# Patient Record
Sex: Male | Born: 1975
Health system: Southern US, Community
[De-identification: ages and names within clinical notes are randomized; demographics above are authoritative.]

## PROBLEM LIST (undated history)

## (undated) DIAGNOSIS — L919 Hypertrophic disorder of the skin, unspecified: Secondary | ICD-10-CM

## (undated) DIAGNOSIS — L909 Atrophic disorder of skin, unspecified: Secondary | ICD-10-CM

## (undated) DIAGNOSIS — G4730 Sleep apnea, unspecified: Secondary | ICD-10-CM

## (undated) DIAGNOSIS — M1990 Unspecified osteoarthritis, unspecified site: Secondary | ICD-10-CM

## (undated) DIAGNOSIS — I10 Essential (primary) hypertension: Secondary | ICD-10-CM

## (undated) DIAGNOSIS — F429 Obsessive-compulsive disorder, unspecified: Secondary | ICD-10-CM

## (undated) DIAGNOSIS — J309 Allergic rhinitis, unspecified: Secondary | ICD-10-CM

## (undated) HISTORY — DX: Essential (primary) hypertension: I10

## (undated) HISTORY — DX: Unspecified osteoarthritis, unspecified site: M19.90

## (undated) HISTORY — DX: Hypertrophic disorder of the skin, unspecified: L91.9

## (undated) HISTORY — DX: Allergic rhinitis, unspecified: J30.9

## (undated) HISTORY — DX: Obsessive-compulsive disorder, unspecified: F42.9

## (undated) HISTORY — DX: Sleep apnea, unspecified: G47.30

## (undated) HISTORY — DX: Atrophic disorder of skin, unspecified: L90.9

---

## 2000-06-20 HISTORY — PX: RHINOPLASTY: SUR1284

## 2000-06-20 HISTORY — PX: TONSILLECTOMY AND ADENOIDECTOMY: SUR1326

## 2003-06-21 DIAGNOSIS — F429 Obsessive-compulsive disorder, unspecified: Secondary | ICD-10-CM | POA: Insufficient documentation

## 2009-06-16 DIAGNOSIS — L909 Atrophic disorder of skin, unspecified: Secondary | ICD-10-CM | POA: Insufficient documentation

## 2010-06-28 ENCOUNTER — Ambulatory Visit: Payer: Self-pay

## 2010-10-31 ENCOUNTER — Emergency Department: Payer: Self-pay | Admitting: Emergency Medicine

## 2011-06-29 DIAGNOSIS — M1711 Unilateral primary osteoarthritis, right knee: Secondary | ICD-10-CM | POA: Insufficient documentation

## 2013-06-06 ENCOUNTER — Ambulatory Visit: Payer: Self-pay

## 2013-06-16 ENCOUNTER — Ambulatory Visit: Payer: Self-pay | Admitting: Family Medicine

## 2014-05-20 LAB — TSH: TSH: 1.35 u[IU]/mL (ref 0.41–5.90)

## 2014-05-20 LAB — BASIC METABOLIC PANEL
BUN: 45 mg/dL — AB (ref 4–21)
Creatinine: 1.1 mg/dL (ref 0.6–1.3)
GLUCOSE: 95 mg/dL
Potassium: 4.7 mmol/L (ref 3.4–5.3)
SODIUM: 139 mmol/L (ref 137–147)

## 2014-05-20 LAB — LIPID PANEL
Cholesterol: 169 mg/dL (ref 0–200)
HDL: 51 mg/dL (ref 35–70)
LDL Cholesterol: 92 mg/dL
Triglycerides: 129 mg/dL (ref 40–160)

## 2014-11-10 ENCOUNTER — Encounter: Payer: Self-pay | Admitting: *Deleted

## 2014-11-10 DIAGNOSIS — I10 Essential (primary) hypertension: Secondary | ICD-10-CM | POA: Insufficient documentation

## 2014-11-10 DIAGNOSIS — R0683 Snoring: Secondary | ICD-10-CM | POA: Insufficient documentation

## 2014-11-10 DIAGNOSIS — M67439 Ganglion, unspecified wrist: Secondary | ICD-10-CM | POA: Insufficient documentation

## 2014-11-10 DIAGNOSIS — R400 Somnolence: Secondary | ICD-10-CM | POA: Insufficient documentation

## 2014-11-10 DIAGNOSIS — M2241 Chondromalacia patellae, right knee: Secondary | ICD-10-CM | POA: Insufficient documentation

## 2014-11-10 DIAGNOSIS — J309 Allergic rhinitis, unspecified: Secondary | ICD-10-CM | POA: Insufficient documentation

## 2014-11-10 DIAGNOSIS — G4733 Obstructive sleep apnea (adult) (pediatric): Secondary | ICD-10-CM | POA: Insufficient documentation

## 2014-12-04 ENCOUNTER — Ambulatory Visit: Payer: Self-pay | Admitting: Family Medicine

## 2014-12-05 ENCOUNTER — Ambulatory Visit (INDEPENDENT_AMBULATORY_CARE_PROVIDER_SITE_OTHER): Payer: 59 | Admitting: Family Medicine

## 2014-12-05 ENCOUNTER — Encounter: Payer: Self-pay | Admitting: Family Medicine

## 2014-12-05 VITALS — BP 140/94 | HR 88 | Temp 97.6°F | Resp 16 | Ht 67.0 in | Wt 301.0 lb

## 2014-12-05 DIAGNOSIS — L409 Psoriasis, unspecified: Secondary | ICD-10-CM

## 2014-12-05 DIAGNOSIS — F429 Obsessive-compulsive disorder, unspecified: Secondary | ICD-10-CM

## 2014-12-05 DIAGNOSIS — F42 Obsessive-compulsive disorder: Secondary | ICD-10-CM | POA: Diagnosis not present

## 2014-12-05 DIAGNOSIS — M7712 Lateral epicondylitis, left elbow: Secondary | ICD-10-CM | POA: Insufficient documentation

## 2014-12-05 DIAGNOSIS — IMO0001 Reserved for inherently not codable concepts without codable children: Secondary | ICD-10-CM

## 2014-12-05 DIAGNOSIS — R030 Elevated blood-pressure reading, without diagnosis of hypertension: Secondary | ICD-10-CM | POA: Diagnosis not present

## 2014-12-05 NOTE — Progress Notes (Signed)
Patient: Joshua Montes Male    DOB: [birth year]   39 y.o.   MRN: [id number] Visit Date: 12/05/2014  Today's Provider: Lelon Huh, MD   Chief Complaint  Patient presents with  . Follow-up    5 month  . Hypertension   Subjective:    Hypertension This is a recurrent problem. The current episode started more than 1 year ago. The problem is unchanged. The problem is uncontrolled. Pertinent negatives include no anxiety, blurred vision, chest pain, headaches, malaise/fatigue, neck pain, orthopnea, palpitations, peripheral edema or shortness of breath. There are no associated agents to hypertension. Past treatments include nothing.     Hypertension, follow-up:  BP Readings from Last 3 Encounters:  12/05/14 140/94  05/20/14 150/106    He was last seen for hypertension 6 months ago.  BP at that visit was 150/106. Management changes since that visit include Advised lifestyle changes. If lifestyle changes do not improve BP will start medication. He reports poor compliance with treatment. He is not having side effects. none  He is not exercising. He is not adherent to low salt diet.   Outside blood pressures are n/a. He is experiencing none.  Patient denies none.   Cardiovascular risk factors include hypertension, male gender and obesity (BMI >= 30 kg/m2).  Use of agents associated with hypertension: none.    Wt Readings from Last 3 Encounters:  12/05/14 301 lb (136.533 kg)  05/20/14 298 lb (135.172 kg)   Weight trend: stable Current diet: in general, an "unhealthy" diet  ------------------------------------------------------------------------  Left elbow has been painful the last few months. No specific injuries, but works a lot on computer which seems to aggravate it. He had steroid shot in his other elbow a few years ago and he didn't think it helped that much.   Follow up on sleep apnea 05/20/2014, no changes were made    Previous Medications   DICLOFENAC SODIUM (VOLTAREN)  1 % GEL    Place 1 application onto the skin every 6 (six) hours as needed.   FLUTICASONE (FLONASE) 50 MCG/ACT NASAL SPRAY    Place 1-2 sprays into the nose daily.   SERTRALINE (ZOLOFT) 100 MG TABLET    Take 1 tablet by mouth daily.    Review of Systems  Constitutional: Negative for malaise/fatigue.  Eyes: Negative for blurred vision.  Respiratory: Negative for shortness of breath.   Cardiovascular: Negative for chest pain, palpitations, orthopnea and leg swelling.  Musculoskeletal: Negative for neck pain.  Neurological: Negative for headaches.    History  Substance Use Topics  . Smoking status: Former Smoker -- 0.50 packs/day for 10 years    Types: Cigarettes    Quit date: 06/21/2007  . Smokeless tobacco: Not on file  . Alcohol Use: 0.0 oz/week    0 Standard drinks or equivalent per week   Family History  Problem Relation Age of Onset  . Cancer Other     lung cancer   . Family History  Problem Relation Age of Onset  . Cancer      lung cancer  . Heart disease Neg Hx   . Stroke Neg Hx      Objective:   BP 140/94 mmHg  Pulse 88  Temp(Src) 97.6 F (36.4 C) (Oral)  Resp 16  Ht 5\' 7"  (1.702 m)  Wt 301 lb (136.533 kg)  BMI 47.13 kg/m2  SpO2 95%  Physical Exam  General Appearance:    Alert, cooperative, no distress, morbidly obese  Eyes:    PERRL,  conjunctiva/corneas clear, EOM's intact       Lungs:     Clear to auscultation bilaterally, respirations unlabored  Heart:    Regular rate and rhythm  Neurologic:   Awake, alert, oriented x 3. No apparent focal neurological           defect.   MS:  Mildly tender left lateral epicondyle. FROM. No swelling or erythema.         Assessment & Plan:     1. Elevated blood pressure He is going to work on reducing calorie intake and reducing sodium in diet. Continue to check every 6 months.   2. Obsessive compulsive disorder He states sertraline is working well and wishes to continue current dose unchanged.   3. Lateral  epicondylitis of left elbow Limit OTC NSAIDs to 1-2 pills a day. Recommend frequent icing. Suggested some ergonomic changes to his workstation. Consider OT or orthopedic referral. He is not interested in steroid injection.    Follow up: Return in about 6 months (around 06/06/2015).

## 2014-12-27 ENCOUNTER — Other Ambulatory Visit: Payer: Self-pay | Admitting: Family Medicine

## 2015-06-05 ENCOUNTER — Ambulatory Visit: Payer: 59 | Admitting: Family Medicine

## 2015-09-01 ENCOUNTER — Other Ambulatory Visit: Payer: Self-pay | Admitting: *Deleted

## 2015-09-01 MED ORDER — SERTRALINE HCL 100 MG PO TABS: 100.0000 mg | ORAL_TABLET | Freq: Every day | ORAL | Status: DC

## 2015-09-01 NOTE — Telephone Encounter (Signed)
Requesting 90 day supply.

## 2016-06-12 ENCOUNTER — Other Ambulatory Visit: Payer: Self-pay | Admitting: Family Medicine

## 2017-09-10 ENCOUNTER — Other Ambulatory Visit: Payer: Self-pay | Admitting: Family Medicine

## 2017-09-11 ENCOUNTER — Telehealth: Payer: Self-pay | Admitting: Family Medicine

## 2017-09-11 MED ORDER — SERTRALINE HCL 100 MG PO TABS: 100.0000 mg | ORAL_TABLET | Freq: Every day | ORAL | 0 refills | Status: DC

## 2017-09-11 NOTE — Telephone Encounter (Signed)
Patient wants to know if he can get one more refill on his sertraline (ZOLOFT) 100 MG tablet  Until he makes a appt     [facility]

## 2017-10-30 ENCOUNTER — Encounter: Payer: Self-pay | Admitting: Family Medicine

## 2017-10-30 ENCOUNTER — Ambulatory Visit (INDEPENDENT_AMBULATORY_CARE_PROVIDER_SITE_OTHER): Payer: 59 | Admitting: Family Medicine

## 2017-10-30 VITALS — BP 140/98 | HR 82 | Temp 97.7°F | Resp 16 | Ht 67.0 in | Wt 322.0 lb

## 2017-10-30 DIAGNOSIS — I10 Essential (primary) hypertension: Secondary | ICD-10-CM | POA: Diagnosis not present

## 2017-10-30 DIAGNOSIS — L409 Psoriasis, unspecified: Secondary | ICD-10-CM | POA: Diagnosis not present

## 2017-10-30 DIAGNOSIS — G4733 Obstructive sleep apnea (adult) (pediatric): Secondary | ICD-10-CM | POA: Diagnosis not present

## 2017-10-30 DIAGNOSIS — Z0000 Encounter for general adult medical examination without abnormal findings: Secondary | ICD-10-CM | POA: Diagnosis not present

## 2017-10-30 DIAGNOSIS — F429 Obsessive-compulsive disorder, unspecified: Secondary | ICD-10-CM

## 2017-10-30 MED ORDER — AMLODIPINE BESYLATE 5 MG PO TABS: 5.0000 mg | ORAL_TABLET | Freq: Every day | ORAL | 3 refills | Status: DC

## 2017-10-30 MED ORDER — TRIAMCINOLONE ACETONIDE 0.5 % EX OINT: 1.0000 "application " | TOPICAL_OINTMENT | Freq: Two times a day (BID) | CUTANEOUS | 0 refills | Status: DC

## 2017-10-30 NOTE — Patient Instructions (Signed)
DASH Eating Plan DASH stands for "Dietary Approaches to Stop Hypertension." The DASH eating plan is a healthy eating plan that has been shown to reduce high blood pressure (hypertension). It may also reduce your risk for type 2 diabetes, heart disease, and stroke. The DASH eating plan may also help with weight loss. What are tips for following this plan? General guidelines  Avoid eating more than 2,300 mg (milligrams) of salt (sodium) a day. If you have hypertension, you may need to reduce your sodium intake to 1,500 mg a day.  Limit alcohol intake to no more than 1 drink a day for nonpregnant women and 2 drinks a day for men. One drink equals 12 oz of beer, 5 oz of wine, or 1 oz of hard liquor.  Work with your health care provider to maintain a healthy body weight or to lose weight. Ask what an ideal weight is for you.  Get at least 30 minutes of exercise that causes your heart to beat faster (aerobic exercise) most days of the week. Activities may include walking, swimming, or biking.  Work with your health care provider or diet and nutrition specialist (dietitian) to adjust your eating plan to your individual calorie needs. Reading food labels  Check food labels for the amount of sodium per serving. Choose foods with less than 5 percent of the Daily Value of sodium. Generally, foods with less than 300 mg of sodium per serving fit into this eating plan.  To find whole grains, look for the word "whole" as the first word in the ingredient list. Shopping  Buy products labeled as "low-sodium" or "no salt added."  Buy fresh foods. Avoid canned foods and premade or frozen meals. Cooking  Avoid adding salt when cooking. Use salt-free seasonings or herbs instead of table salt or sea salt. Check with your health care provider or pharmacist before using salt substitutes.  Do not fry foods. Cook foods using healthy methods such as baking, boiling, grilling, and broiling instead.  Cook with  heart-healthy oils, such as olive, canola, soybean, or sunflower oil. Meal planning   Eat a balanced diet that includes: ? 5 or more servings of fruits and vegetables each day. At each meal, try to fill half of your plate with fruits and vegetables. ? Up to 6-8 servings of whole grains each day. ? Less than 6 oz of lean meat, poultry, or fish each day. A 3-oz serving of meat is about the same size as a deck of cards. One egg equals 1 oz. ? 2 servings of low-fat dairy each day. ? A serving of nuts, seeds, or beans 5 times each week. ? Heart-healthy fats. Healthy fats called Omega-3 fatty acids are found in foods such as flaxseeds and coldwater fish, like sardines, salmon, and mackerel.  Limit how much you eat of the following: ? Canned or prepackaged foods. ? Food that is high in trans fat, such as fried foods. ? Food that is high in saturated fat, such as fatty meat. ? Sweets, desserts, sugary drinks, and other foods with added sugar. ? Full-fat dairy products.  Do not salt foods before eating.  Try to eat at least 2 vegetarian meals each week.  Eat more home-cooked food and less restaurant, buffet, and fast food.  When eating at a restaurant, ask that your food be prepared with less salt or no salt, if possible. What foods are recommended? The items listed may not be a complete list. Talk with your dietitian about what   dietary choices are best for you. Grains Whole-grain or whole-wheat bread. Whole-grain or whole-wheat pasta. Brown rice. Oatmeal. Quinoa. Bulgur. Whole-grain and low-sodium cereals. Pita bread. Low-fat, low-sodium crackers. Whole-wheat flour tortillas. Vegetables Fresh or frozen vegetables (raw, steamed, roasted, or grilled). Low-sodium or reduced-sodium tomato and vegetable juice. Low-sodium or reduced-sodium tomato sauce and tomato paste. Low-sodium or reduced-sodium canned vegetables. Fruits All fresh, dried, or frozen fruit. Canned fruit in natural juice (without  added sugar). Meat and other protein foods Skinless chicken or turkey. Ground chicken or turkey. Pork with fat trimmed off. Fish and seafood. Egg whites. Dried beans, peas, or lentils. Unsalted nuts, nut butters, and seeds. Unsalted canned beans. Lean cuts of beef with fat trimmed off. Low-sodium, lean deli meat. Dairy Low-fat (1%) or fat-free (skim) milk. Fat-free, low-fat, or reduced-fat cheeses. Nonfat, low-sodium ricotta or cottage cheese. Low-fat or nonfat yogurt. Low-fat, low-sodium cheese. Fats and oils Soft margarine without trans fats. Vegetable oil. Low-fat, reduced-fat, or light mayonnaise and salad dressings (reduced-sodium). Canola, safflower, olive, soybean, and sunflower oils. Avocado. Seasoning and other foods Herbs. Spices. Seasoning mixes without salt. Unsalted popcorn and pretzels. Fat-free sweets. What foods are not recommended? The items listed may not be a complete list. Talk with your dietitian about what dietary choices are best for you. Grains Baked goods made with fat, such as croissants, muffins, or some breads. Dry pasta or rice meal packs. Vegetables Creamed or fried vegetables. Vegetables in a cheese sauce. Regular canned vegetables (not low-sodium or reduced-sodium). Regular canned tomato sauce and paste (not low-sodium or reduced-sodium). Regular tomato and vegetable juice (not low-sodium or reduced-sodium). Pickles. Olives. Fruits Canned fruit in a light or heavy syrup. Fried fruit. Fruit in cream or butter sauce. Meat and other protein foods Fatty cuts of meat. Ribs. Fried meat. Bacon. Sausage. Bologna and other processed lunch meats. Salami. Fatback. Hotdogs. Bratwurst. Salted nuts and seeds. Canned beans with added salt. Canned or smoked fish. Whole eggs or egg yolks. Chicken or turkey with skin. Dairy Whole or 2% milk, cream, and half-and-half. Whole or full-fat cream cheese. Whole-fat or sweetened yogurt. Full-fat cheese. Nondairy creamers. Whipped toppings.  Processed cheese and cheese spreads. Fats and oils Butter. Stick margarine. Lard. Shortening. Ghee. Bacon fat. Tropical oils, such as coconut, palm kernel, or palm oil. Seasoning and other foods Salted popcorn and pretzels. Onion salt, garlic salt, seasoned salt, table salt, and sea salt. Worcestershire sauce. Tartar sauce. Barbecue sauce. Teriyaki sauce. Soy sauce, including reduced-sodium. Steak sauce. Canned and packaged gravies. Fish sauce. Oyster sauce. Cocktail sauce. Horseradish that you find on the shelf. Ketchup. Mustard. Meat flavorings and tenderizers. Bouillon cubes. Hot sauce and Tabasco sauce. Premade or packaged marinades. Premade or packaged taco seasonings. Relishes. Regular salad dressings. Where to find more information:  National Heart, Lung, and Blood Institute: www.nhlbi.nih.gov  American Heart Association: www.heart.org Summary  The DASH eating plan is a healthy eating plan that has been shown to reduce high blood pressure (hypertension). It may also reduce your risk for type 2 diabetes, heart disease, and stroke.  With the DASH eating plan, you should limit salt (sodium) intake to 2,300 mg a day. If you have hypertension, you may need to reduce your sodium intake to 1,500 mg a day.  When on the DASH eating plan, aim to eat more fresh fruits and vegetables, whole grains, lean proteins, low-fat dairy, and heart-healthy fats.  Work with your health care provider or diet and nutrition specialist (dietitian) to adjust your eating plan to your individual   calorie needs. This information is not intended to replace advice given to you by your health care provider. Make sure you discuss any questions you have with your health care provider. Document Released: 05/26/2011 Document Revised: 05/30/2016 Document Reviewed: 05/30/2016 Elsevier Interactive Patient Education  2018 Elsevier Inc.  

## 2017-10-30 NOTE — Progress Notes (Signed)
Patient: Joshua Montes, Male    DOB: 01-30-1976, 42 y.o.   MRN: [id number] Visit Date: 10/30/2017  Today's Provider: Lelon Huh, MD   Chief Complaint  Patient presents with  . Annual Exam  . Hypertension   Subjective:    Annual physical exam Joshua Montes is a 42 y.o. male who presents today for health maintenance and complete physical. He feels well. He reports exercising is. He reports he is sleeping well. Plays golf and tennis 1-2 times a week.  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     Wt Readings from Last 3 Encounters:  10/30/17 (!) 322 lb (146.1 kg)  12/05/14 (!) 301 lb (136.5 kg)  05/20/14 298 lb (135.2 kg)     ----------------------------------------------------------------  Elevated blood pressure From 12/04/2016-advised to work on reducing calorie intake and reducing sodium in diet.    Obsessive compulsive disorder From 12/04/2016-no changes were made. He states sertraline continues to work well. OCD is well controlled and not interfering with work.   Has had psoriatic plaques on backs of elbows and knees for a few years and getting worse. Uses Aveeno cream on psoriatic lesions with very little effect.  Is using Bipap for OSA he states it is working well without any .   Review of Systems  Constitutional: Negative for chills, diaphoresis and fever.  HENT: Negative for congestion, ear discharge, ear pain, hearing loss, nosebleeds, sore throat and tinnitus.   Eyes: Negative for photophobia, pain, discharge and redness.  Respiratory: Positive for apnea. Negative for cough, shortness of breath, wheezing and stridor.   Cardiovascular: Negative for chest pain, palpitations and leg swelling.  Gastrointestinal: Negative for abdominal pain, blood in stool, constipation, diarrhea, nausea and vomiting.  Endocrine: Negative for polydipsia.  Genitourinary: Negative for dysuria, flank pain, frequency, hematuria and urgency.  Musculoskeletal: Negative for  back pain, myalgias and neck pain.  Allergic/Immunologic: Negative for environmental allergies.  Neurological: Negative for dizziness, tremors, seizures, weakness and headaches.  Hematological: Does not bruise/bleed easily.  Psychiatric/Behavioral: Negative for hallucinations and suicidal ideas. The patient is not nervous/anxious.     Social History      He  reports that he quit smoking about 10 years ago. His smoking use included cigarettes. He has a 5.00 pack-year smoking history. He has never used smokeless tobacco. He reports that he drinks alcohol. He reports that he does not use drugs.       Social History   Socioeconomic History  . Marital status: Married    Spouse name: Not on file  . Number of children: Not on file  . Years of education: Coll/Grad  . Highest education level: Not on file  Occupational History  . Not on file  Social Needs  . Financial resource strain: Not on file  . Food insecurity:    Worry: Not on file    Inability: Not on file  . Transportation needs:    Medical: Not on file    Non-medical: Not on file  Tobacco Use  . Smoking status: Former Smoker    Packs/day: 0.50    Years: 10.00    Pack years: 5.00    Types: Cigarettes    Last attempt to quit: 06/21/2007    Years since quitting: 10.3  . Smokeless tobacco: Never Used  Substance and Sexual Activity  . Alcohol use: Yes    Alcohol/week: 0.0 oz  . Drug use: No  . Sexual activity: Not on file  Lifestyle  .  Physical activity:    Days per week: Not on file    Minutes per session: Not on file  . Stress: Not on file  Relationships  . Social connections:    Talks on phone: Not on file    Gets together: Not on file    Attends religious service: Not on file    Active member of club or organization: Not on file    Attends meetings of clubs or organizations: Not on file    Relationship status: Not on file  Other Topics Concern  . Not on file  Social History Narrative  . Not on file    Past  Medical History:  Diagnosis Date  . Allergic rhinitis   . Hypertension   . Hypertrophic and atrophic condition of skin   . Obsessive compulsive disorder   . Osteoarthritis    Right Knee  . Sleep apnea      Patient Active Problem List   Diagnosis Date Noted  . Lateral epicondylitis of left elbow 12/05/2014  . Psoriasis 12/05/2014  . Allergic rhinitis 11/10/2014  . Chondromalacia of right patella 11/10/2014  . Elevated blood pressure 11/10/2014  . Sleep apnea, obstructive 11/10/2014  . Snoring 11/10/2014  . Osteoarthritis of right knee 06/29/2011  . Hypertrophic and atrophic condition of skin 06/16/2009  . Obesity, morbid ([place]) 01/21/2009  . Obsessive compulsive disorder 06/21/2003    Past Surgical History:  Procedure Laterality Date  . RHINOPLASTY  2002  . TONSILLECTOMY AND ADENOIDECTOMY  2002    Family History        Family Status  Relation Name Status  . Mother  Alive  . Father  Alive  . Brother  Alive  . Unknown  (Not Specified)  . Neg Hx  (Not Specified)        His family history includes Cancer in his unknown relative. There is no history of Heart disease or Stroke.      No Known Allergies   Current Outpatient Medications:  .  sertraline (ZOLOFT) 100 MG tablet, Take 1 tablet (100 mg total) by mouth daily., Disp: 90 tablet, Rfl: 0   Patient Care Team: Birdie Sons, MD as PCP - General (Family Medicine)      Objective:   Vitals: BP (!) 140/98 (BP Location: Right Arm, Patient Position: Sitting, Cuff Size: Large)   Pulse 82   Temp 97.7 F (36.5 C) (Oral)   Resp 16   Ht 5\' 7"  (1.702 m)   Wt (!) 322 lb (146.1 kg)   SpO2 96%   BMI 50.43 kg/m    Vitals:   10/30/17 0916  BP: (!) 140/98  Pulse: 82  Resp: 16  Temp: 97.7 F (36.5 C)  TempSrc: Oral  SpO2: 96%  Weight: (!) 322 lb (146.1 kg)  Height: 5\' 7"  (1.702 m)     Physical Exam   General Appearance:    Alert, cooperative, no distress, appears stated age, morbidly obese  Head:     Normocephalic, without obvious abnormality, atraumatic  Eyes:    PERRL, conjunctiva/corneas clear, EOM's intact, fundi    benign, both eyes       Ears:    Normal TM's and external ear canals, both ears  Nose:   Nares normal, septum midline, mucosa normal, no drainage   or sinus tenderness  Throat:   Lips, mucosa, and tongue normal; teeth and gums normal  Neck:   Supple, symmetrical, trachea midline, no adenopathy;       thyroid:  No enlargement/tenderness/nodules; no carotid   bruit or JVD  Back:     Symmetric, no curvature, ROM normal, no CVA tenderness  Lungs:     Clear to auscultation bilaterally, respirations unlabored  Chest wall:    No tenderness or deformity  Heart:    Regular rate and rhythm, S1 and S2 normal, no murmur, rub   or gallop  Abdomen:     Soft, non-tender, bowel sounds active all four quadrants,    no masses, no organomegaly  Genitalia:    deferred  Rectal:    deferred  Extremities:   Extremities normal, atraumatic, no cyanosis or edema  Pulses:   2+ and symmetric all extremities  Skin:   Moderate psoriatic plaques extensor surfaces of elbows, knees, and forearm.   Lymph nodes:   Cervical, supraclavicular, and axillary nodes normal  Neurologic:   CNII-XII intact. Normal strength, sensation and reflexes      throughout    Depression Screen PHQ 2/9 Scores 10/30/2017  PHQ - 2 Score 0  PHQ- 9 Score 0      Assessment & Plan:     Routine Health Maintenance and Physical Exam  Exercise Activities and Dietary recommendations Goals    None      Immunization History  Administered Date(s) Administered  . Influenza,inj,Quad PF,6+ Mos 02/18/2014  . Tdap 03/24/2010    Health Maintenance  Topic Date Due  . HIV Screening  09/22/1989  . INFLUENZA VACCINE  01/18/2018  . TETANUS/TDAP  03/24/2020     Discussed health benefits of physical activity, and encouraged him to engage in regular exercise appropriate for his age and condition.      --------------------------------------------------------------------  1. Annual physical exam  - Comprehensive metabolic panel - Lipid panel - TSH  2. Psoriasis start - triamcinolone ointment (KENALOG) 0.5 %; Apply 1 application topically 2 (two) times daily.  Dispense: 30 g; Refill: 0  3. Sleep apnea, obstructive Using Bipap every night which is medically benefiting patient. The current medical regimen is effective;  continue present plan and medications.   4. Obesity, morbid ([place]) Counseled regarding prudent diet and regular exercise.    5. Essential hypertension start - amLODipine (NORVASC) 5 MG tablet; Take 1 tablet (5 mg total) by mouth daily.  Dispense: 90 tablet; Refill: 3  Return in about 2 months (around 12/30/2017) for blood pressure .    Lelon Huh, MD  [facility]

## 2017-10-31 LAB — COMPREHENSIVE METABOLIC PANEL
ALT: 28 IU/L (ref 0–44)
AST: 23 IU/L (ref 0–40)
Albumin/Globulin Ratio: 1.6 (ref 1.2–2.2)
Albumin: 4.4 g/dL (ref 3.5–5.5)
Alkaline Phosphatase: 68 IU/L (ref 39–117)
BUN/Creatinine Ratio: 12 (ref 9–20)
BUN: 14 mg/dL (ref 6–24)
Bilirubin Total: 0.3 mg/dL (ref 0.0–1.2)
CO2: 27 mmol/L (ref 20–29)
CREATININE: 1.16 mg/dL (ref 0.76–1.27)
Calcium: 9.5 mg/dL (ref 8.7–10.2)
Chloride: 97 mmol/L (ref 96–106)
GFR calc Af Amer: 89 mL/min/{1.73_m2} (ref >59)
GFR calc non Af Amer: 77 mL/min/{1.73_m2} (ref >59)
Globulin, Total: 2.8 g/dL (ref 1.5–4.5)
Glucose: 90 mg/dL (ref 65–99)
Potassium: 4.2 mmol/L (ref 3.5–5.2)
Sodium: 139 mmol/L (ref 134–144)
Total Protein: 7.2 g/dL (ref 6.0–8.5)

## 2017-10-31 LAB — LIPID PANEL
Chol/HDL Ratio: 2.9 ratio (ref 0.0–5.0)
Cholesterol, Total: 145 mg/dL (ref 100–199)
HDL: 50 mg/dL (ref >39)
LDL CALC: 81 mg/dL (ref 0–99)
Triglycerides: 70 mg/dL (ref 0–149)
VLDL Cholesterol Cal: 14 mg/dL (ref 5–40)

## 2017-10-31 LAB — TSH: TSH: 1.64 u[IU]/mL (ref 0.450–4.500)

## 2017-12-03 ENCOUNTER — Other Ambulatory Visit: Payer: Self-pay | Admitting: Family Medicine

## 2017-12-08 DIAGNOSIS — G4733 Obstructive sleep apnea (adult) (pediatric): Secondary | ICD-10-CM | POA: Diagnosis not present

## 2018-01-15 NOTE — Progress Notes (Signed)
Patient: Joshua Montes Male    DOB: June 07, 1976   42 y.o.   MRN: [id number] Visit Date: 01/16/2018  Today's Provider: Lelon Huh, MD   Chief Complaint  Patient presents with  . Follow-up  . Hypertension   Subjective:    HPI   Hypertension, follow-up:  BP Readings from Last 3 Encounters:  01/16/18 (!) 150/98  10/30/17 (!) 140/98  12/05/14 (!) 140/94    He was last seen for hypertension 2 months ago.  BP at that visit was 140/98. Management since that visit includes; started amlodipine.He reports fair compliance with treatment. He is not having side effects. none He is not exercising. He is not adherent to low salt diet.   Outside blood pressures are not checking. He is experiencing none.  Patient denies none.   Cardiovascular risk factors include none.  Use of agents associated with hypertension: none.   Wt Readings from Last 5 Encounters:  10/30/17 (!) 322 lb (146.1 kg)  12/05/14 (!) 301 lb (136.5 kg)  05/20/14 298 lb (135.2 kg)    ---------------------------------------------------------------  Psoriasis From 10/30/2017-started triamcinolone ointment (KENALOG) 0.5 %.  Patient states kenalog cream works well.   Patient also wanted to discuss right breast/nipple pain that he has been experiencing for 1 month now. Patient states nipple is sore to the touch.   Also patient states he has had low back pain for 2 weeks.    No Known Allergies   Current Outpatient Medications:  .  amLODipine (NORVASC) 5 MG tablet, Take 1 tablet (5 mg total) by mouth daily., Disp: 90 tablet, Rfl: 3 .  sertraline (ZOLOFT) 100 MG tablet, TAKE 1 TABLET BY MOUTH EVERY DAY, Disp: 90 tablet, Rfl: 3 .  triamcinolone ointment (KENALOG) 0.5 %, Apply 1 application topically 2 (two) times daily., Disp: 30 g, Rfl: 0  Review of Systems  Constitutional: Negative for appetite change, chills and fever.  Respiratory: Negative for chest tightness, shortness of breath and wheezing.     Cardiovascular: Negative for chest pain and palpitations.  Gastrointestinal: Negative for abdominal pain, nausea and vomiting.  Musculoskeletal: Positive for back pain.    Social History   Tobacco Use  . Smoking status: Former Smoker    Packs/day: 0.50    Years: 10.00    Pack years: 5.00    Types: Cigarettes    Last attempt to quit: 06/21/2007    Years since quitting: 10.5  . Smokeless tobacco: Never Used  Substance Use Topics  . Alcohol use: Yes    Alcohol/week: 0.0 oz   Objective:   BP (!) 150/98 (BP Location: Right Arm, Cuff Size: Large)   Pulse 95   Temp 98 F (36.7 C) (Oral)   Resp 16   SpO2 98%  Vitals:   01/16/18 0827 01/16/18 0832  BP: (!) 158/114 (!) 150/98  Pulse: 95   Resp: 16   Temp: 98 F (36.7 C)   TempSrc: Oral   SpO2: 98%      Physical Exam  General Appearance:    Alert, cooperative, no distress, obese  Eyes:    PERRL, conjunctiva/corneas clear, EOM's intact       Lungs:     Clear to auscultation bilaterally, respirations unlabored  Heart:    Regular rate and rhythm  Breast:   No masses, swelling, redness, or tenderness.   MS:   Slight tenderness bilateral para lumbar muscles.          Assessment & Plan:  1. Essential hypertension No improvement with amlodipine. He admits to poor dietary habits, but does get a lot of exercise or physical activity. Will add- hydrochlorothiazide (HYDRODIURIL) 25 MG tablet; Take 1 tablet (25 mg total) by mouth daily.  Dispense: 90 tablet; Refill: 1 Return in about 1 month (around 02/16/2018).   2. Bilateral low back pain without sciatica, unspecified chronicity  - cyclobenzaprine (FLEXERIL) 5 MG tablet; Take 1-2 tablets (5-10 mg total) by mouth 3 (three) times daily as needed for muscle spasms.  Dispense: 30 tablet; Refill: 1  3. Mastalgia Normal exam, no sign of infection. Recheck at follow up. If persistent sx will check prolactin levels and consider u/s.        Lelon Huh, MD  [facility]

## 2018-01-16 ENCOUNTER — Encounter: Payer: Self-pay | Admitting: Family Medicine

## 2018-01-16 ENCOUNTER — Ambulatory Visit (INDEPENDENT_AMBULATORY_CARE_PROVIDER_SITE_OTHER): Payer: 59 | Admitting: Family Medicine

## 2018-01-16 VITALS — BP 150/98 | HR 95 | Temp 98.0°F | Resp 16

## 2018-01-16 DIAGNOSIS — N644 Mastodynia: Secondary | ICD-10-CM

## 2018-01-16 DIAGNOSIS — I10 Essential (primary) hypertension: Secondary | ICD-10-CM | POA: Diagnosis not present

## 2018-01-16 DIAGNOSIS — M545 Low back pain, unspecified: Secondary | ICD-10-CM

## 2018-01-16 MED ORDER — HYDROCHLOROTHIAZIDE 25 MG PO TABS: 25.0000 mg | ORAL_TABLET | Freq: Every day | ORAL | 1 refills | Status: DC

## 2018-01-16 MED ORDER — CYCLOBENZAPRINE HCL 5 MG PO TABS
5.0000 mg | ORAL_TABLET | Freq: Three times a day (TID) | ORAL | 1 refills | Status: AC | PRN
Start: 2018-01-16 — End: ?

## 2018-02-21 ENCOUNTER — Ambulatory Visit (INDEPENDENT_AMBULATORY_CARE_PROVIDER_SITE_OTHER): Payer: 59 | Admitting: Family Medicine

## 2018-02-21 ENCOUNTER — Encounter: Payer: Self-pay | Admitting: Family Medicine

## 2018-02-21 VITALS — BP 134/81 | HR 93 | Temp 98.4°F | Resp 16

## 2018-02-21 DIAGNOSIS — I10 Essential (primary) hypertension: Secondary | ICD-10-CM | POA: Diagnosis not present

## 2018-02-21 DIAGNOSIS — N644 Mastodynia: Secondary | ICD-10-CM

## 2018-02-21 DIAGNOSIS — Z23 Encounter for immunization: Secondary | ICD-10-CM | POA: Diagnosis not present

## 2018-02-21 NOTE — Progress Notes (Signed)
Patient: Joshua Montes Male    DOB: 1976-05-03   42 y.o.   MRN: [id number] Visit Date: 02/21/2018  Today's Provider: Lelon Huh, MD   Chief Complaint  Patient presents with  . Follow-up  . Hypertension   Subjective:    HPI   Hypertension, follow-up:  BP Readings from Last 3 Encounters:  02/21/18 (!) 149/90  01/16/18 (!) 150/98  10/30/17 (!) 140/98    He was last seen for hypertension 1 months ago.  BP at that visit was 150/98. Management since that visit includes; added HCTZ 25 mg qd. Advised to follow up in 1 month.He reports good compliance with treatment. He is not having side effects. none He is not exercising. He is not adherent to low salt diet.   Outside blood pressures are not checking.  He states he recently went on paleo diet and has lost 15 pounds in the last couple of weeks.    He has also had sensation left nipple for a couple of months, which comes and goes. Not usually sore, and sensation goes away if pushes on area. Has not noticed any lumps or masses.  ---------------------------------------------------------------      No Known Allergies   Current Outpatient Medications:  .  amLODipine (NORVASC) 5 MG tablet, Take 1 tablet (5 mg total) by mouth daily., Disp: 90 tablet, Rfl: 3 .  cyclobenzaprine (FLEXERIL) 5 MG tablet, Take 1-2 tablets (5-10 mg total) by mouth 3 (three) times daily as needed for muscle spasms., Disp: 30 tablet, Rfl: 1 .  hydrochlorothiazide (HYDRODIURIL) 25 MG tablet, Take 1 tablet (25 mg total) by mouth daily., Disp: 90 tablet, Rfl: 1 .  sertraline (ZOLOFT) 100 MG tablet, TAKE 1 TABLET BY MOUTH EVERY DAY, Disp: 90 tablet, Rfl: 3 .  triamcinolone ointment (KENALOG) 0.5 %, Apply 1 application topically 2 (two) times daily., Disp: 30 g, Rfl: 0  Review of Systems  Constitutional: Negative for appetite change, chills and fever.  Respiratory: Negative for chest tightness, shortness of breath and wheezing.   Cardiovascular:  Negative for chest pain and palpitations.  Gastrointestinal: Negative for abdominal pain, nausea and vomiting.    Social History   Tobacco Use  . Smoking status: Former Smoker    Packs/day: 0.50    Years: 10.00    Pack years: 5.00    Types: Cigarettes    Last attempt to quit: 06/21/2007    Years since quitting: 10.6  . Smokeless tobacco: Never Used  Substance Use Topics  . Alcohol use: Yes    Alcohol/week: 0.0 standard drinks   Objective:    Vitals:   02/21/18 0813 02/21/18 0818  BP: (!) 149/90 134/81  Pulse: 93   Resp: 16   Temp: 98.4 F (36.9 C)   TempSrc: Oral   SpO2: 94%      Physical Exam  General appearance: alert, well developed, well nourished, cooperative and in no distress Head: Normocephalic, without obvious abnormality, atraumatic Breast: no tenderness, erythema, masses, or other abnormalities of right breast or areola in area of concern.      Assessment & Plan:      1. Essential hypertension Improved with addition of hctz. Continue current medications.  Follow up bp check in 4-5 months.   2. Mastalgia No sign of underlying pathology. Continue to monitor. Call if any change or if any discrete nodules develop.   3. Need for influenza vaccination  - Flu Vaccine QUAD 36+ mos IM  Lelon Huh, MD  [facility]

## 2018-04-09 DIAGNOSIS — I10 Essential (primary) hypertension: Secondary | ICD-10-CM | POA: Diagnosis not present

## 2018-04-09 DIAGNOSIS — J00 Acute nasopharyngitis [common cold]: Secondary | ICD-10-CM | POA: Diagnosis not present

## 2018-04-09 DIAGNOSIS — R05 Cough: Secondary | ICD-10-CM | POA: Diagnosis not present

## 2018-04-29 ENCOUNTER — Other Ambulatory Visit: Payer: Self-pay | Admitting: Family Medicine

## 2018-04-29 DIAGNOSIS — L409 Psoriasis, unspecified: Secondary | ICD-10-CM

## 2018-07-24 ENCOUNTER — Other Ambulatory Visit: Payer: Self-pay | Admitting: Family Medicine

## 2018-07-24 DIAGNOSIS — I10 Essential (primary) hypertension: Secondary | ICD-10-CM

## 2018-07-25 ENCOUNTER — Ambulatory Visit: Payer: Self-pay | Admitting: Family Medicine

## 2018-09-14 ENCOUNTER — Ambulatory Visit (INDEPENDENT_AMBULATORY_CARE_PROVIDER_SITE_OTHER): Payer: BLUE CROSS/BLUE SHIELD | Admitting: Family Medicine

## 2018-09-14 ENCOUNTER — Other Ambulatory Visit: Payer: Self-pay

## 2018-09-14 ENCOUNTER — Encounter: Payer: Self-pay | Admitting: Family Medicine

## 2018-09-14 VITALS — Temp 97.9°F | Ht 67.0 in | Wt 309.0 lb

## 2018-09-14 DIAGNOSIS — J302 Other seasonal allergic rhinitis: Secondary | ICD-10-CM | POA: Diagnosis not present

## 2018-09-14 NOTE — Progress Notes (Signed)
Virtual Visit via Video Note  I connected with Joshua Montes on 09/14/18 at  2:40 PM EDT by a video enabled telemedicine application and verified that I am speaking with the correct person using two identifiers.   I discussed the limitations of evaluation and management by telemedicine and the availability of in person appointments. The patient expressed understanding and agreed to proceed.  History of Present Illness: Developed sinus pressure, PND, stuffy nose and slight cough over the past 1.5 weeks. Some episodes of eyes burning and no productive cough or fever. Throat has been a little scratchy and symptoms more prominent early morning and late evening. Took a decongestant to day and feeling much better now. Has a history of some seasonal allergies. Past Medical History:  Diagnosis Date  . Allergic rhinitis   . Hypertension   . Hypertrophic and atrophic condition of skin   . Obsessive compulsive disorder   . Osteoarthritis    Right Knee  . Sleep apnea    Allergies as of 09/14/2018   No Known Allergies     Medication List       Accurate as of September 14, 2018  3:01 PM. Always use your most recent med list.        amLODipine 5 MG tablet Commonly known as:  NORVASC Take 1 tablet (5 mg total) by mouth daily.   cyclobenzaprine 5 MG tablet Commonly known as:  FLEXERIL Take 1-2 tablets (5-10 mg total) by mouth 3 (three) times daily as needed for muscle spasms.   hydrochlorothiazide 25 MG tablet Commonly known as:  HYDRODIURIL TAKE 1 TABLET BY MOUTH EVERY DAY   sertraline 100 MG tablet Commonly known as:  ZOLOFT TAKE 1 TABLET BY MOUTH EVERY DAY   triamcinolone ointment 0.5 % Commonly known as:  KENALOG APPLY TO AFFECTED AREA TWICE A DAY       Observations/Objective: Today's Vitals   09/14/18 1445  Temp: 97.9 F (36.6 C)  TempSrc: Oral  Weight: (!) 309 lb (140.2 kg)  Height: 5\' 7"  (1.702 m)   Body mass index is 48.4 kg/m. WDWN male in no apparent distress.   Head: Normocephalic, atraumatic. Neck: Supple, NROM Respiratory: No apparent distress Psych: Normal mood and affect Throat: No significant erythema visible.  Assessment and Plan: 1. Seasonal allergic rhinitis, unspecified trigger Nasal congestion, PND, slight cough and occasional eyes burning over the past 1.5 weeks. No fever. Remembers moving some books and feeling a little short winded for a few seconds. May use Mucinex-D, Delsym and add Fluticasone Nasal Spray 2 sprays each nostril at bedtime. Encouraged social distancing and to stay home the next 3 days. Patient agrees with plan and will notify us in 3 days if needed. No one else in his house if ill.   Follow Up Instructions: Does not appear ill or dyspneic today. Agrees to notify this office in 3 days if any worsening or fever development.   I discussed the assessment and treatment plan with the patient. The patient was provided an opportunity to ask questions and all were answered. The patient agreed with the plan and demonstrated an understanding of the instructions.   The patient was advised to call back or seek an in-person evaluation if the symptoms worsen or if the condition fails to improve as anticipated.  I provided 20 minutes of non-face-to-face time during this encounter.   Vernie Murders, PA

## 2018-10-23 ENCOUNTER — Other Ambulatory Visit: Payer: Self-pay | Admitting: Family Medicine

## 2018-10-23 DIAGNOSIS — I10 Essential (primary) hypertension: Secondary | ICD-10-CM

## 2018-12-11 ENCOUNTER — Other Ambulatory Visit: Payer: Self-pay | Admitting: Family Medicine

## 2018-12-11 NOTE — Telephone Encounter (Signed)
Pt contacted office for refill request on the following medications:  sertraline (ZOLOFT) 100 MG tablet  [facility]  Pt is requesting Rx be sent in today because he is out of the medication and stated he didn't realize he was out of refills. Please advise. Thanks TNP

## 2019-02-01 ENCOUNTER — Telehealth: Payer: Self-pay

## 2019-02-01 DIAGNOSIS — R42 Dizziness and giddiness: Secondary | ICD-10-CM

## 2019-02-01 MED ORDER — MECLIZINE HCL 25 MG PO TABS: 25.0000 mg | ORAL_TABLET | Freq: Three times a day (TID) | ORAL | 0 refills | Status: AC | PRN

## 2019-02-01 NOTE — Telephone Encounter (Signed)
Patient called saying that he has had a really bad vertigo episode today. He reports that his symptoms started late last night. He has had ear issues before, but it usually subsides. He also mentions that he doesn't usually get vertigo with his ear pain. However, patient reports that the dizziness is so bad that it is causing him to vomit.   Patient is requesting that we send in something to help with dizziness and nausea. [facility]. Contact info is correct. Thanks!

## 2019-02-01 NOTE — Telephone Encounter (Signed)
Patient was advised.  

## 2019-02-01 NOTE — Telephone Encounter (Signed)
Meclizine sent to [facility]. This is a pill for dizziness that is also an anti-emetic.

## 2019-02-01 NOTE — Telephone Encounter (Signed)
Pt needing a call back on the kind of medicine prescribed.  He is also needing to know what the medication was being prescribed for.  Basic diagnosis.  Thanks, [employer]

## 2019-02-03 ENCOUNTER — Emergency Department: Payer: BC Managed Care – PPO

## 2019-02-03 ENCOUNTER — Encounter: Payer: Self-pay | Admitting: Intensive Care

## 2019-02-03 ENCOUNTER — Emergency Department
Admission: EM | Admit: 2019-02-03 | Discharge: 2019-02-03 | Disposition: A | Discharge status: Other Acute Inpatient Hospital | Payer: BC Managed Care – PPO | Attending: Emergency Medicine | Admitting: Emergency Medicine

## 2019-02-03 ENCOUNTER — Other Ambulatory Visit: Payer: Self-pay

## 2019-02-03 DIAGNOSIS — Z87891 Personal history of nicotine dependence: Secondary | ICD-10-CM | POA: Insufficient documentation

## 2019-02-03 DIAGNOSIS — I10 Essential (primary) hypertension: Secondary | ICD-10-CM | POA: Insufficient documentation

## 2019-02-03 DIAGNOSIS — R404 Transient alteration of awareness: Secondary | ICD-10-CM | POA: Diagnosis not present

## 2019-02-03 DIAGNOSIS — R42 Dizziness and giddiness: Secondary | ICD-10-CM

## 2019-02-03 DIAGNOSIS — G919 Hydrocephalus, unspecified: Secondary | ICD-10-CM | POA: Diagnosis not present

## 2019-02-03 DIAGNOSIS — D496 Neoplasm of unspecified behavior of brain: Secondary | ICD-10-CM

## 2019-02-03 DIAGNOSIS — G9389 Other specified disorders of brain: Secondary | ICD-10-CM | POA: Diagnosis not present

## 2019-02-03 DIAGNOSIS — C716 Malignant neoplasm of cerebellum: Secondary | ICD-10-CM | POA: Diagnosis not present

## 2019-02-03 DIAGNOSIS — Z20828 Contact with and (suspected) exposure to other viral communicable diseases: Secondary | ICD-10-CM | POA: Diagnosis not present

## 2019-02-03 DIAGNOSIS — R0902 Hypoxemia: Secondary | ICD-10-CM | POA: Diagnosis not present

## 2019-02-03 LAB — CBC
HCT: 42.5 % (ref 39.0–52.0)
Hemoglobin: 14.4 g/dL (ref 13.0–17.0)
MCH: 29 pg (ref 26.0–34.0)
MCHC: 33.9 g/dL (ref 30.0–36.0)
MCV: 85.5 fL (ref 80.0–100.0)
Platelets: 310 10*3/uL (ref 150–400)
RBC: 4.97 MIL/uL (ref 4.22–5.81)
RDW: 12.8 % (ref 11.5–15.5)
WBC: 13.6 10*3/uL — ABNORMAL HIGH (ref 4.0–10.5)
nRBC: 0 % (ref 0.0–0.2)

## 2019-02-03 LAB — BASIC METABOLIC PANEL
Anion gap: 16 — ABNORMAL HIGH (ref 5–15)
BUN: 18 mg/dL (ref 6–20)
CO2: 28 mmol/L (ref 22–32)
Calcium: 9.2 mg/dL (ref 8.9–10.3)
Chloride: 89 mmol/L — ABNORMAL LOW (ref 98–111)
Creatinine, Ser: 1.13 mg/dL (ref 0.61–1.24)
GFR calc Af Amer: >60 mL/min (ref >60)
GFR calc non Af Amer: >60 mL/min (ref >60)
Glucose, Bld: 102 mg/dL — ABNORMAL HIGH (ref 70–99)
Potassium: 3.2 mmol/L — ABNORMAL LOW (ref 3.5–5.1)
Sodium: 133 mmol/L — ABNORMAL LOW (ref 135–145)

## 2019-02-03 LAB — SARS CORONAVIRUS 2 BY RT PCR (HOSPITAL ORDER, PERFORMED IN ~~LOC~~ HOSPITAL LAB): SARS Coronavirus 2: NEGATIVE

## 2019-02-03 LAB — TROPONIN I (HIGH SENSITIVITY): Troponin I (High Sensitivity): 12 ng/L (ref <18)

## 2019-02-03 MED ORDER — DEXAMETHASONE SODIUM PHOSPHATE 10 MG/ML IJ SOLN
10.0000 mg | Freq: Once | INTRAMUSCULAR | Status: AC
  Administered 2019-02-03: 22:00:00 10 mg via INTRAVENOUS
  Filled 2019-02-03: qty 1

## 2019-02-03 MED ORDER — DIAZEPAM 5 MG PO TABS
5.0000 mg | ORAL_TABLET | Freq: Once | ORAL | Status: AC
  Administered 2019-02-03: 5 mg via ORAL
  Filled 2019-02-03: qty 1

## 2019-02-03 MED ORDER — MECLIZINE HCL 25 MG PO TABS
50.0000 mg | ORAL_TABLET | Freq: Once | ORAL | Status: AC
  Administered 2019-02-03: 19:00:00 50 mg via ORAL
  Filled 2019-02-03: qty 2

## 2019-02-03 MED ORDER — SODIUM CHLORIDE 0.9 % IV SOLN
Freq: Once | INTRAVENOUS | Status: AC
  Administered 2019-02-03: 19:00:00 via INTRAVENOUS

## 2019-02-03 MED ORDER — ONDANSETRON HCL 4 MG/2ML IJ SOLN
4.0000 mg | Freq: Once | INTRAMUSCULAR | Status: AC
  Administered 2019-02-03: 19:00:00 4 mg via INTRAVENOUS
  Filled 2019-02-03: qty 2

## 2019-02-03 NOTE — ED Triage Notes (Addendum)
PAtient reports dizziness for a few weeks with nausea, vomiting, and headache. Also c/o fatigue

## 2019-02-03 NOTE — ED Notes (Signed)
Report from ann, rn.

## 2019-02-03 NOTE — ED Notes (Signed)
Pt states that starting a few weeks ago, he began to feel dizzy. Pt states that dizziness occurs upon activity and while resting. Pt denies relief with medication or rest. Pt stated that he does become nausea, fatigued, and vomits due to dizziness.

## 2019-02-03 NOTE — ED Notes (Signed)
Pt requesting father sign consent for transfer to [facility]

## 2019-02-03 NOTE — ED Notes (Signed)
Report to April, RN

## 2019-02-03 NOTE — ED Notes (Signed)
First Nurse Note: Pt to ED via POV c/o vertigo x several weeks. Pt states that he talked to his son earlier and he does not remember talking to him. Pt is in NAD at this time.

## 2019-02-03 NOTE — ED Provider Notes (Signed)
[facility] Emergency Department Provider Note       Time seen: ----------------------------------------- 6:43 PM on 02/03/2019 -----------------------------------------   I have reviewed the triage vital signs and the nursing notes.  HISTORY   Chief Complaint Dizziness    HPI Joshua Montes is a 43 y.o. male with a history of allergic rhinitis, hypertension, OCD, osteoarthritis, sleep apnea who presents to the ED for dizziness and room spinning sensation for several weeks.  He has had occasional episodes of nausea and vomiting.  He describes 3 out of 10 discomfort in his head and fatigue.  He reports no change in his medications, no recent illness.  Past Medical History:  Diagnosis Date  . Allergic rhinitis   . Hypertension   . Hypertrophic and atrophic condition of skin   . Obsessive compulsive disorder   . Osteoarthritis    Right Knee  . Sleep apnea     Patient Active Problem List   Diagnosis Date Noted  . Psoriasis 12/05/2014  . Allergic rhinitis 11/10/2014  . Chondromalacia of right patella 11/10/2014  . Essential hypertension 11/10/2014  . Sleep apnea, obstructive 11/10/2014  . Snoring 11/10/2014  . Osteoarthritis of right knee 06/29/2011  . Hypertrophic and atrophic condition of skin 06/16/2009  . Obesity, morbid ([place]) 01/21/2009  . Obsessive compulsive disorder 06/21/2003    Past Surgical History:  Procedure Laterality Date  . RHINOPLASTY  2002  . TONSILLECTOMY AND ADENOIDECTOMY  2002    Allergies Patient has no known allergies.  Social History Social History   Tobacco Use  . Smoking status: Former Smoker    Packs/day: 0.50    Years: 10.00    Pack years: 5.00    Types: Cigarettes    Quit date: 06/21/2007    Years since quitting: 11.6  . Smokeless tobacco: Never Used  Substance Use Topics  . Alcohol use: Yes    Alcohol/week: 5.0 standard drinks    Types: 5 Cans of beer per week  . Drug use: No   Review of  Systems Constitutional: Negative for fever. Cardiovascular: Negative for chest pain. Respiratory: Negative for shortness of breath. Gastrointestinal: Negative for abdominal pain, positive for vomiting Musculoskeletal: Negative for back pain. Skin: Negative for rash. Neurological: Negative for headache currently, focal weakness or numbness.  Positive for dizziness  All systems negative/normal/unremarkable except as stated in the HPI  ____________________________________________   PHYSICAL EXAM:  VITAL SIGNS: ED Triage Vitals  Enc Vitals Group     BP 02/03/19 1716 (!) 158/97     Pulse Rate 02/03/19 1716 95     Resp 02/03/19 1716 16     Temp 02/03/19 1716 98.3 F (36.8 C)     Temp Source 02/03/19 1716 Oral     SpO2 02/03/19 1716 99 %     Weight 02/03/19 1711 (!) 317 lb (143.8 kg)     Height 02/03/19 1711 5\' 7"  (1.702 m)     Head Circumference --      Peak Flow --      Pain Score 02/03/19 1711 3     Pain Loc --      Pain Edu? --      Excl. in [place]? --     Constitutional: Alert and oriented.  Obese, no acute distress Eyes: Conjunctivae are normal. Normal extraocular movements. ENT      Head: Normocephalic and atraumatic.      Nose: No congestion/rhinnorhea.      Mouth/Throat: Mucous membranes are moist.  Neck: No stridor. Cardiovascular: Normal rate, regular rhythm. No murmurs, rubs, or gallops. Respiratory: Normal respiratory effort without tachypnea nor retractions. Breath sounds are clear and equal bilaterally. No wheezes/rales/rhonchi. Gastrointestinal: Soft and nontender. Normal bowel sounds Musculoskeletal: Nontender with normal range of motion in extremities. No lower extremity tenderness nor edema. Neurologic:  Normal speech and language. No gross focal neurologic deficits are appreciated.  Skin:  Skin is warm, dry and intact. No rash noted. Psychiatric: Mood and affect are normal. Speech and behavior are normal.   ____________________________________________  EKG: Interpreted by me.  Sinus rhythm with rate of 93 bpm, normal PR interval, normal QRS, normal QT  ____________________________________________  ED COURSE:  As part of my medical decision making, I reviewed the following data within the [place] History obtained from family if available, nursing notes, old chart and ekg, as well as notes from prior ED visits. Patient presented for dizziness and vertigo, we will assess with labs and imaging as indicated at this time.   Procedures  Wai Litt was evaluated in Emergency Department on 02/03/2019 for the symptoms described in the history of present illness. He was evaluated in the context of the global COVID-19 pandemic, which necessitated consideration that the patient might be at risk for infection with the SARS-CoV-2 virus that causes COVID-19. Institutional protocols and algorithms that pertain to the evaluation of patients at risk for COVID-19 are in a state of rapid change based on information released by regulatory bodies including the CDC and federal and state organizations. These policies and algorithms were followed during the patient's care in the ED.  ____________________________________________   LABS (pertinent positives/negatives)  Labs Reviewed  BASIC METABOLIC PANEL - Abnormal; Notable for the following components:      Result Value   Sodium 133 (*)    Potassium 3.2 (*)    Chloride 89 (*)    Glucose, Bld 102 (*)    Anion gap 16 (*)    All other components within normal limits  CBC - Abnormal; Notable for the following components:   WBC 13.6 (*)    All other components within normal limits  BLOOD GAS, VENOUS - Abnormal; Notable for the following components:   pH, Ven 7.44 (*)    Bicarbonate 36.7 (*)    Acid-Base Excess 10.5 (*)    All other components within normal limits  URINALYSIS, COMPLETE (UACMP) WITH MICROSCOPIC  TROPONIN I (HIGH SENSITIVITY)    CRITICAL CARE Performed by: Laurence Aly   Total critical care time: 30 minutes  Critical care time was exclusive of separately billable procedures and treating other patients.  Critical care was necessary to treat or prevent imminent or life-threatening deterioration.  Critical care was time spent personally by me on the following activities: development of treatment plan with patient and/or surrogate as well as nursing, discussions with consultants, evaluation of patient's response to treatment, examination of patient, obtaining history from patient or surrogate, ordering and performing treatments and interventions, ordering and review of laboratory studies, ordering and review of radiographic studies, pulse oximetry and re-evaluation of patient's condition.   RADIOLOGY Images were viewed by me  CT head IMPRESSION: 1. 4.6 x 3.6 cm left cerebellar mass with mild adjacent edema and mass effect on the adjacent 4th ventricle. This has an appearance compatible with a primary or metastatic brain neoplasm. An hemangioblastoma can have this appearance. An infectious cause is less likely but not excluded. Further evaluation with a brain MRI without and with contrast is  recommended. 2. The mass effect on the 4th ventricle is producing mild-to-moderate hydrocephalus with mild bilateral transependymal resorption and some effacement of the cortical sulci in both cerebral hemispheres. 3. Somewhat high density basilar and left middle cerebral arteries with a much smaller right middle cerebral artery. It is unlikely that the high-density represents thrombus since this involves 2 different arterial distributions. In talking with Dr. Jimmye Norman, the patient has no symptoms of cerebral infarction. This could be related to slow flow in these vessels. ____________________________________________   DIFFERENTIAL DIAGNOSIS   Dehydration, electrolyte abnormality, peripheral vertigo, central  vertigo, medication side effect  FINAL ASSESSMENT AND PLAN  Cerebellar Mass, hydrocephalus, dizziness   Plan: The patient had presented for persistent dizziness. Patient's labs did likely reflect HCTZ use with low sodium and chloride levels.  He was given IV fluids here as well as Zofran, meclizine and Valium.  Patient's imaging surprisingly revealed a left cerebellar mass with edema and mass-effect on the fourth ventricle with also mild to moderate hydrocephalus due to the mass-effect on the fourth ventricle.  Other than balance disturbance, he appears neurologically intact.  I will discuss with neurosurgery for transfer.   Laurence Aly, MD    Note: This note was generated in part or whole with voice recognition software. Voice recognition is usually quite accurate but there are transcription errors that can and very often do occur. I apologize for any typographical errors that were not detected and corrected.     Earleen Newport, MD 02/03/19 2032

## 2019-02-04 DIAGNOSIS — H53002 Unspecified amblyopia, left eye: Secondary | ICD-10-CM | POA: Diagnosis not present

## 2019-02-04 DIAGNOSIS — Z87891 Personal history of nicotine dependence: Secondary | ICD-10-CM | POA: Diagnosis not present

## 2019-02-04 DIAGNOSIS — D481 Neoplasm of uncertain behavior of connective and other soft tissue: Secondary | ICD-10-CM | POA: Diagnosis not present

## 2019-02-04 DIAGNOSIS — D431 Neoplasm of uncertain behavior of brain, infratentorial: Secondary | ICD-10-CM | POA: Diagnosis not present

## 2019-02-04 DIAGNOSIS — H4710 Unspecified papilledema: Secondary | ICD-10-CM | POA: Diagnosis not present

## 2019-02-04 DIAGNOSIS — R42 Dizziness and giddiness: Secondary | ICD-10-CM | POA: Diagnosis not present

## 2019-02-04 DIAGNOSIS — E279 Disorder of adrenal gland, unspecified: Secondary | ICD-10-CM | POA: Diagnosis not present

## 2019-02-04 DIAGNOSIS — G930 Cerebral cysts: Secondary | ICD-10-CM | POA: Diagnosis not present

## 2019-02-04 DIAGNOSIS — Z20828 Contact with and (suspected) exposure to other viral communicable diseases: Secondary | ICD-10-CM | POA: Diagnosis not present

## 2019-02-04 DIAGNOSIS — R55 Syncope and collapse: Secondary | ICD-10-CM | POA: Diagnosis not present

## 2019-02-04 DIAGNOSIS — Z9889 Other specified postprocedural states: Secondary | ICD-10-CM | POA: Diagnosis not present

## 2019-02-04 DIAGNOSIS — F429 Obsessive-compulsive disorder, unspecified: Secondary | ICD-10-CM | POA: Diagnosis not present

## 2019-02-04 DIAGNOSIS — G9389 Other specified disorders of brain: Secondary | ICD-10-CM | POA: Diagnosis not present

## 2019-02-04 DIAGNOSIS — G919 Hydrocephalus, unspecified: Secondary | ICD-10-CM | POA: Diagnosis not present

## 2019-02-04 DIAGNOSIS — G4733 Obstructive sleep apnea (adult) (pediatric): Secondary | ICD-10-CM | POA: Diagnosis not present

## 2019-02-04 DIAGNOSIS — F40240 Claustrophobia: Secondary | ICD-10-CM | POA: Diagnosis not present

## 2019-02-04 DIAGNOSIS — I10 Essential (primary) hypertension: Secondary | ICD-10-CM | POA: Diagnosis not present

## 2019-02-04 DIAGNOSIS — C716 Malignant neoplasm of cerebellum: Secondary | ICD-10-CM | POA: Diagnosis not present

## 2019-02-04 DIAGNOSIS — H527 Unspecified disorder of refraction: Secondary | ICD-10-CM | POA: Diagnosis not present

## 2019-02-04 DIAGNOSIS — Z9989 Dependence on other enabling machines and devices: Secondary | ICD-10-CM | POA: Diagnosis not present

## 2019-02-04 DIAGNOSIS — R112 Nausea with vomiting, unspecified: Secondary | ICD-10-CM | POA: Diagnosis not present

## 2019-02-04 DIAGNOSIS — D496 Neoplasm of unspecified behavior of brain: Secondary | ICD-10-CM | POA: Diagnosis not present

## 2019-02-04 DIAGNOSIS — Z79899 Other long term (current) drug therapy: Secondary | ICD-10-CM | POA: Diagnosis not present

## 2019-02-04 DIAGNOSIS — D432 Neoplasm of uncertain behavior of brain, unspecified: Secondary | ICD-10-CM | POA: Diagnosis not present

## 2019-02-04 NOTE — Telephone Encounter (Signed)
LMTCB

## 2019-02-11 DIAGNOSIS — D432 Neoplasm of uncertain behavior of brain, unspecified: Secondary | ICD-10-CM | POA: Diagnosis not present

## 2019-02-12 ENCOUNTER — Telehealth: Payer: Self-pay

## 2019-02-12 DIAGNOSIS — E162 Hypoglycemia, unspecified: Secondary | ICD-10-CM | POA: Diagnosis not present

## 2019-02-12 DIAGNOSIS — E161 Other hypoglycemia: Secondary | ICD-10-CM | POA: Diagnosis not present

## 2019-02-12 DIAGNOSIS — I499 Cardiac arrhythmia, unspecified: Secondary | ICD-10-CM | POA: Diagnosis not present

## 2019-02-12 DIAGNOSIS — R404 Transient alteration of awareness: Secondary | ICD-10-CM | POA: Diagnosis not present

## 2019-02-15 ENCOUNTER — Telehealth: Payer: Self-pay

## 2019-02-15 NOTE — Telephone Encounter (Signed)
Velva Harman from [facility] had called office requesting that we fax over patients death certificate this morning. Velva Harman states that patient is scheduled to be cremated this morning and they need death certificate ASAP. She is requesting a call back as soon as provider signs certificate. She can be reached at [phone]. KW

## 2019-02-19 LAB — BLOOD GAS, VENOUS
Acid-Base Excess: 10.5 mmol/L — ABNORMAL HIGH (ref 0.0–2.0)
Bicarbonate: 36.7 mmol/L — ABNORMAL HIGH (ref 20.0–28.0)
O2 Saturation: 70 %
Patient temperature: 37
pCO2, Ven: 54 mmHg (ref 44.0–60.0)
pH, Ven: 7.44 — ABNORMAL HIGH (ref 7.250–7.430)
pO2, Ven: 35 mmHg (ref 32.0–45.0)

## 2019-02-19 NOTE — Telephone Encounter (Signed)
Officer Sweat with [facility] police department called reporting that the patient was found in the his home unresponsive, and was found to have had a cardiac arrest. EMS was called to the home and worked on him for 30-45 minutes. Office Sweat wanted to know if Dr. Caryn Section would sign the death certificate. Patient recently had brain surgery at [facility], but the neurosurgeon told the officer that he does not sign death certificates. I consulted with Dr. Caryn Section about this, and he agreed to sign death certificate. I advised office Sweat.

## 2019-02-19 DEATH — deceased

## 2020-06-20 IMAGING — CT CT HEAD WITHOUT CONTRAST
4 of 5 series · 13 of 47 positions shown, 15 images · non-contrast
Comparison: None.

CLINICAL DATA: Ataxia. Dizziness for the past few weeks. Nausea.
Fatigue. Vomiting.

EXAM:
CT HEAD WITHOUT CONTRAST
TECHNIQUE: Contiguous axial images were obtained from the base of the skull
through the vertex without intravenous contrast.

[Series 2: head wo · axial · 0.44mm/px · z∈[-36,-6]mm · 2 of 30 slices shown (1 of 2)]
[im 6/30  brain]
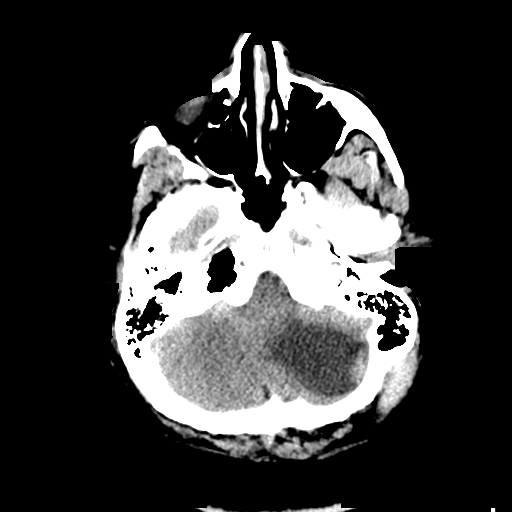
[im 12/30  brain]
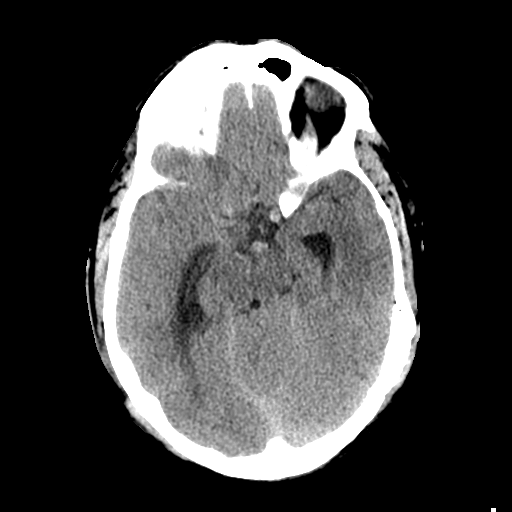

[Series 4: head wo · axial · 0.37mm/px · z∈[-51,+49]mm · 5 of 31 slices shown, 7 images (2 of 2)]
[im 6/31  brain]
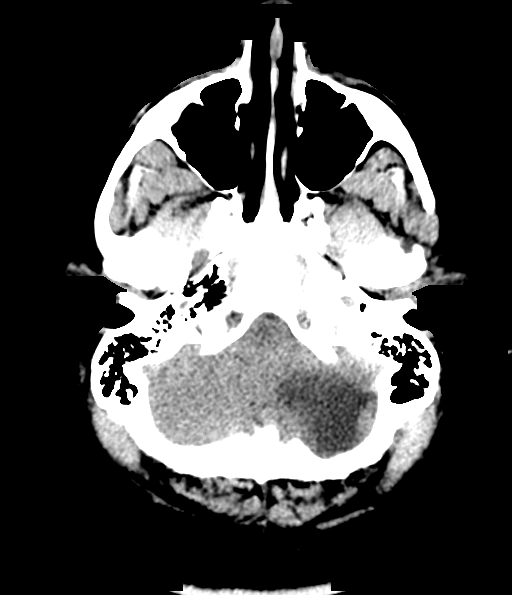
[im 6/31  bone]
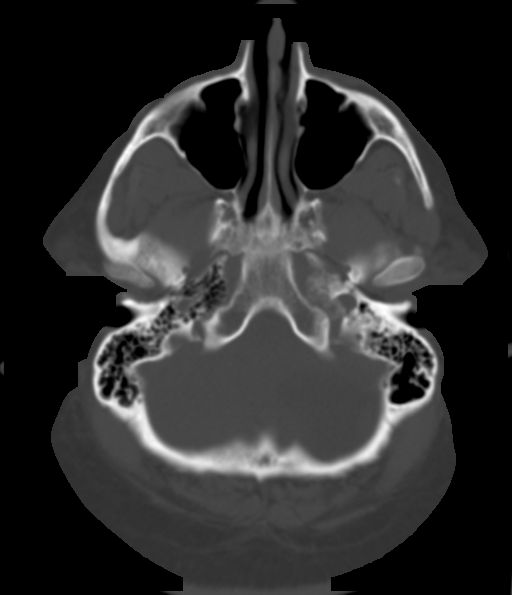
[im 11/31  brain]
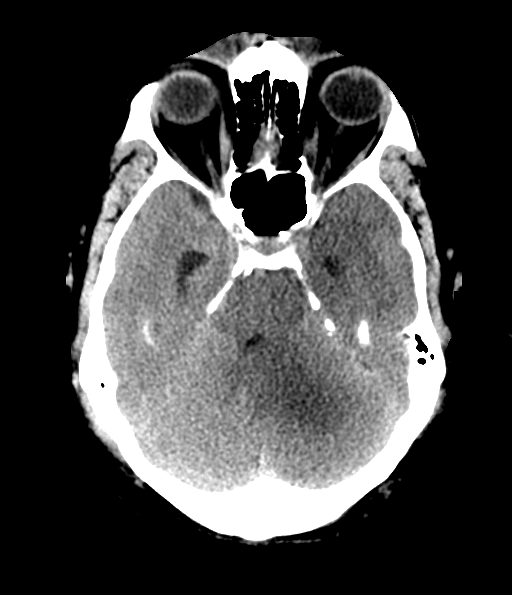
[im 16/31  brain]
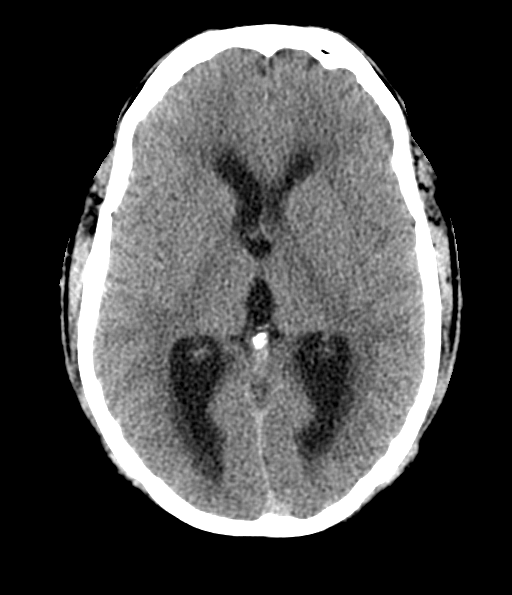
[im 21/31  brain]
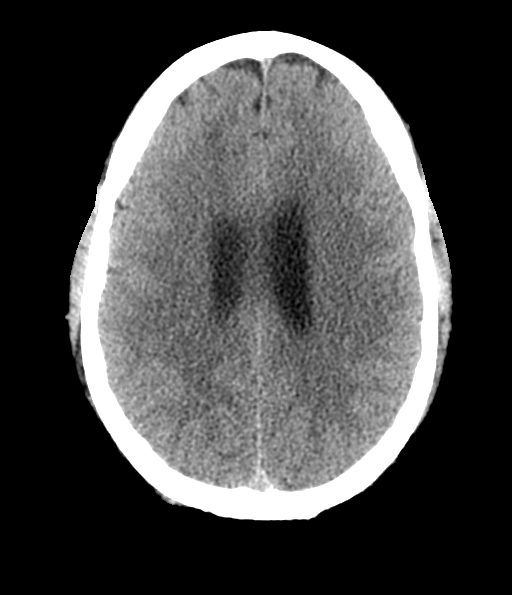
[im 26/31  brain]
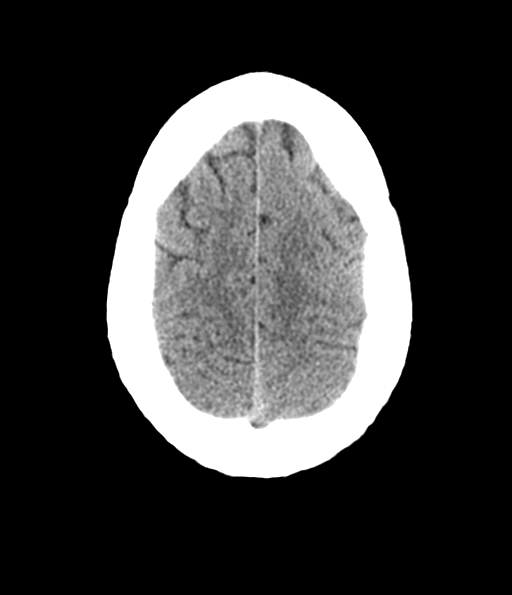
[im 26/31  bone]
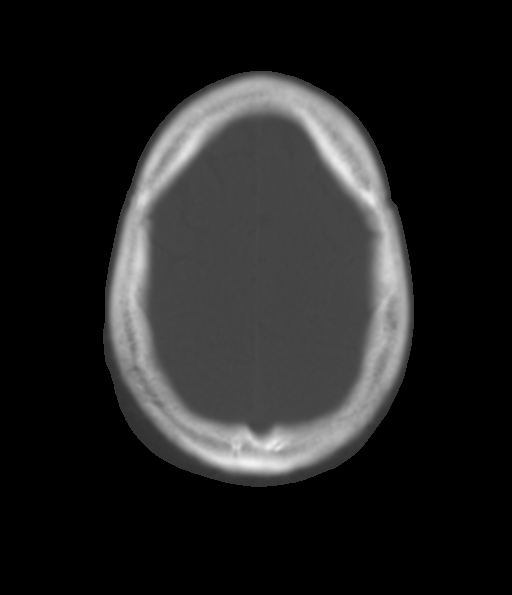

[Series 6: coronal soft tissue · coronal · 0.31mm/px · 3 of 75 slices shown]
[im 25/75  brain]
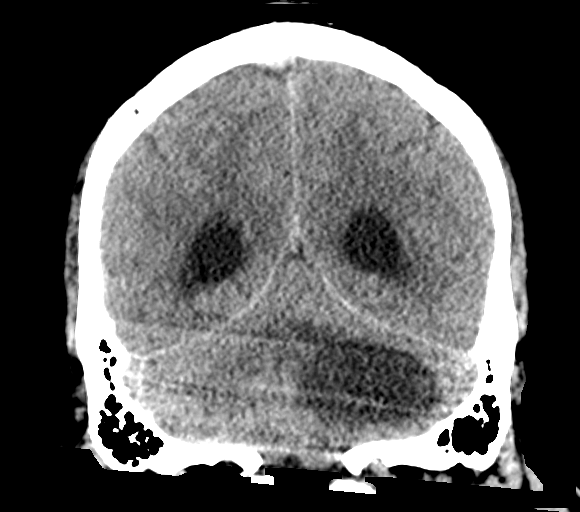
[im 33/75  brain]
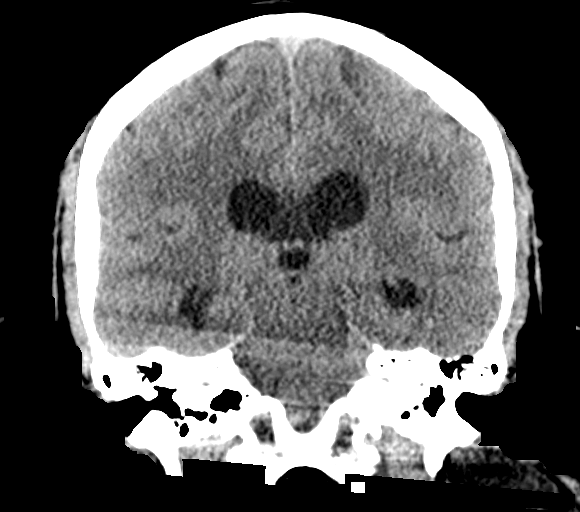
[im 42/75  brain]
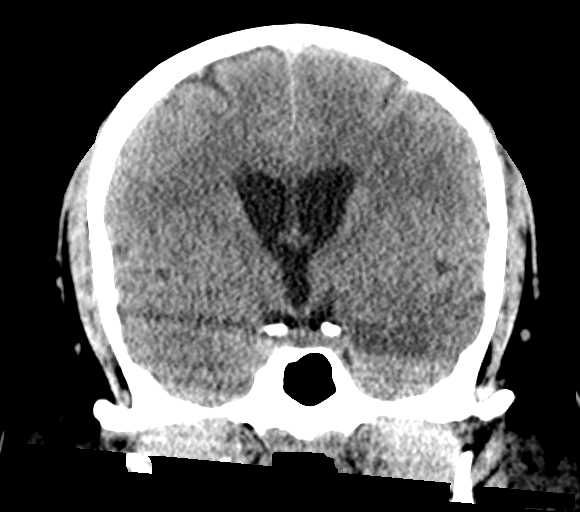

[Series 7: sagittal soft tissue · sagittal · 0.31mm/px · 3 of 60 slices shown]
[im 20/60  brain]
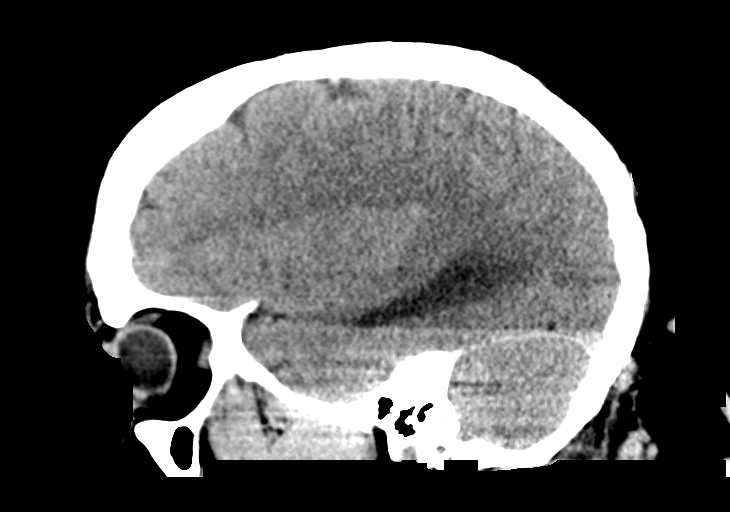
[im 30/60  brain]
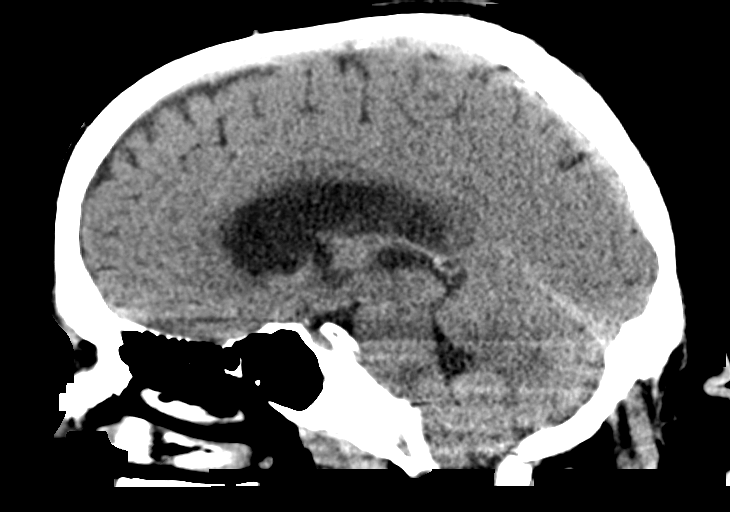
[im 40/60  brain]
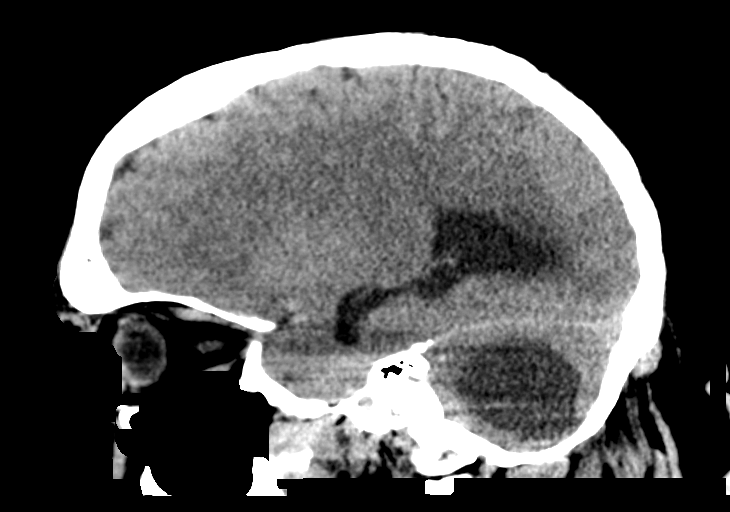

[13 of 47 positions shown; findings below may reference images not displayed]

FINDINGS: Brain: Interval 4.6 x 3.6 cm oval, circumscribed, low density mass
in the left cerebellum with mild adjacent ill-defined low density
and mass effect on the adjacent 4th ventricle, displacing the
ventricle to the right. Associated mild to moderate enlargement of
the 3rd and lateral ventricles. Mild periventricular white matter
low density in both cerebral hemispheres and some effacement of the
sulci in both cerebral hemispheres. There is also some effacement of
the quadrigeminal cistern and right cerebellopontine cistern. There
is a suggestion of an oval eccentric soft tissue density mass in
inferolateral aspect of the low density component of the mass. This
measures 1.4 x 0.8 cm on sagittal image number 44 and 0.7 cm in
width on coronal image number 56. No intracranial hemorrhage or CT
evidence of acute infarction.

Vascular: Somewhat high density basilar and left middle cerebral
arteries with a much smaller right middle cerebral artery.

Skull: Normal. Negative for fracture or focal lesion.

Sinuses/Orbits: No acute finding.

Other: None.
IMPRESSION: 1. 4.6 x 3.6 cm left cerebellar mass with mild adjacent edema and
mass effect on the adjacent 4th ventricle. This has an appearance
compatible with a primary or metastatic brain neoplasm. An
hemangioblastoma can have this appearance. An infectious cause is
less likely but not excluded. Further evaluation with a brain MRI
without and with contrast is recommended.
2. The mass effect on the 4th ventricle is producing
mild-to-moderate hydrocephalus with mild bilateral transependymal
resorption and some effacement of the cortical sulci in both
cerebral hemispheres.
3. Somewhat high density basilar and left middle cerebral arteries
with a much smaller right middle cerebral artery. It is unlikely
that the high-density represents thrombus since this involves 2
different arterial distributions. In talking with Dr. Takeharu, the
patient has no symptoms of cerebral infarction. This could be
related to slow flow in these vessels.

These results were called by telephone at the time of interpretation
on 02/03/2019 at [DATE] to Dr. NURYS MEHARI , who verbally
acknowledged these results.
